# Patient Record
Sex: Female | Born: 1950 | Hispanic: No | Marital: Married | Smoking: Never smoker
Health system: Southern US, Community
[De-identification: ages and names within clinical notes are randomized; demographics above are authoritative.]

## PROBLEM LIST (undated history)

## (undated) DIAGNOSIS — D649 Anemia, unspecified: Secondary | ICD-10-CM

## (undated) DIAGNOSIS — K219 Gastro-esophageal reflux disease without esophagitis: Secondary | ICD-10-CM

## (undated) DIAGNOSIS — D493 Neoplasm of unspecified behavior of breast: Secondary | ICD-10-CM

## (undated) DIAGNOSIS — T7840XA Allergy, unspecified, initial encounter: Secondary | ICD-10-CM

## (undated) DIAGNOSIS — H269 Unspecified cataract: Secondary | ICD-10-CM

## (undated) HISTORY — PX: POLYPECTOMY: SHX149

## (undated) HISTORY — DX: Anemia, unspecified: D64.9

## (undated) HISTORY — PX: COLONOSCOPY: SHX174

## (undated) HISTORY — DX: Neoplasm of unspecified behavior of breast: D49.3

## (undated) HISTORY — DX: Unspecified cataract: H26.9

## (undated) HISTORY — PX: OTHER SURGICAL HISTORY: SHX169

## (undated) HISTORY — PX: HIATAL HERNIA REPAIR: SHX195

## (undated) HISTORY — PX: EYE SURGERY: SHX253

## (undated) HISTORY — DX: Allergy, unspecified, initial encounter: T78.40XA

## (undated) HISTORY — DX: Gastro-esophageal reflux disease without esophagitis: K21.9

---

## 2000-06-12 ENCOUNTER — Other Ambulatory Visit: Admission: RE | Admit: 2000-06-12 | Discharge: 2000-06-12 | Payer: Self-pay | Admitting: Obstetrics and Gynecology

## 2001-12-31 ENCOUNTER — Other Ambulatory Visit: Admission: RE | Admit: 2001-12-31 | Discharge: 2001-12-31 | Payer: Self-pay | Admitting: Obstetrics and Gynecology

## 2003-01-28 ENCOUNTER — Other Ambulatory Visit: Admission: RE | Admit: 2003-01-28 | Discharge: 2003-01-28 | Payer: Self-pay | Admitting: Obstetrics and Gynecology

## 2003-02-23 ENCOUNTER — Ambulatory Visit (HOSPITAL_COMMUNITY): Admission: RE | Admit: 2003-02-23 | Discharge: 2003-02-23 | Payer: Self-pay | Admitting: Obstetrics and Gynecology

## 2003-02-23 ENCOUNTER — Encounter: Payer: Self-pay | Admitting: Obstetrics and Gynecology

## 2005-08-17 ENCOUNTER — Other Ambulatory Visit: Admission: RE | Admit: 2005-08-17 | Discharge: 2005-08-17 | Payer: Self-pay | Admitting: Obstetrics and Gynecology

## 2005-09-15 ENCOUNTER — Encounter: Payer: Self-pay | Admitting: Obstetrics and Gynecology

## 2005-09-15 ENCOUNTER — Ambulatory Visit (HOSPITAL_COMMUNITY): Admission: RE | Admit: 2005-09-15 | Discharge: 2005-09-15 | Payer: Self-pay | Admitting: Obstetrics and Gynecology

## 2005-09-29 ENCOUNTER — Ambulatory Visit: Payer: Self-pay | Admitting: Internal Medicine

## 2005-10-11 ENCOUNTER — Ambulatory Visit: Payer: Self-pay | Admitting: Internal Medicine

## 2006-01-18 ENCOUNTER — Encounter (INDEPENDENT_AMBULATORY_CARE_PROVIDER_SITE_OTHER): Payer: Self-pay | Admitting: *Deleted

## 2006-01-18 ENCOUNTER — Ambulatory Visit (HOSPITAL_COMMUNITY): Admission: RE | Admit: 2006-01-18 | Discharge: 2006-01-19 | Payer: Self-pay | Admitting: General Surgery

## 2006-03-02 ENCOUNTER — Ambulatory Visit: Payer: Self-pay | Admitting: Internal Medicine

## 2006-03-16 ENCOUNTER — Ambulatory Visit: Payer: Self-pay | Admitting: Internal Medicine

## 2006-03-16 ENCOUNTER — Encounter: Payer: Self-pay | Admitting: Internal Medicine

## 2007-09-02 IMAGING — RF DG ESOPHAGUS
7 of 8 series · 19 of 24 positions shown · non-contrast
Comparison: None.

CLINICAL DATA: Dysphagia.
 BARIUM SWALLOW:
TECHNIQUE: The patient was studied both upright and flat.  Included in the exams are 3 per second spot films in the cervical region, AP and lateral, plus ingestion of a 13 mm barium tablet.

[Series 1: run · 6 of 8 slices shown (1 of 7)]
[im 1/8]
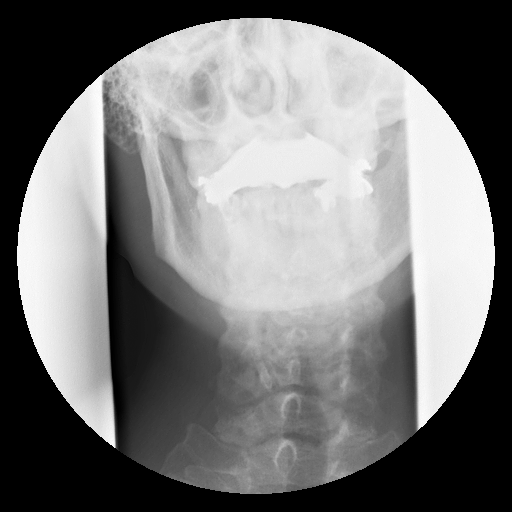
[im 2/8]
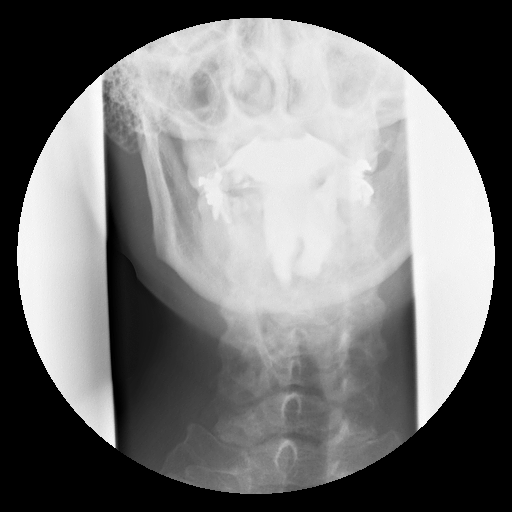
[im 4/8]
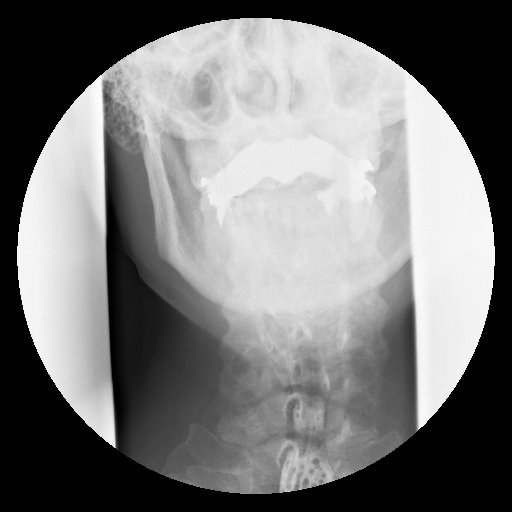
[im 5/8]
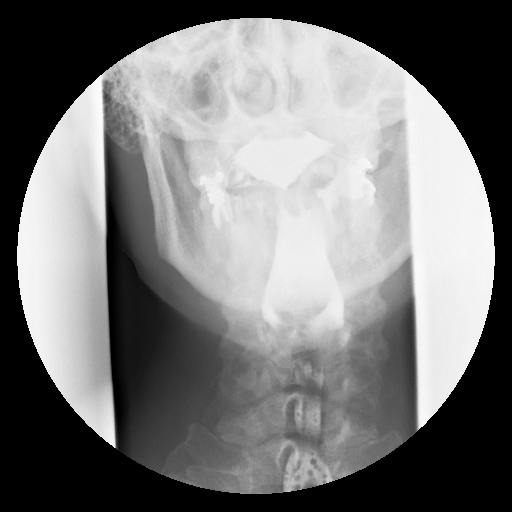
[im 6/8]
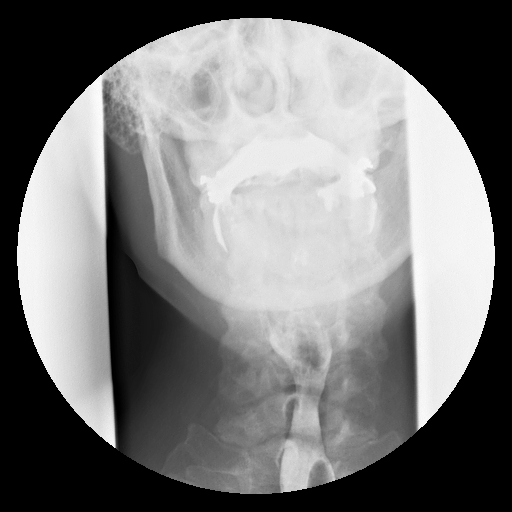
[im 8/8]
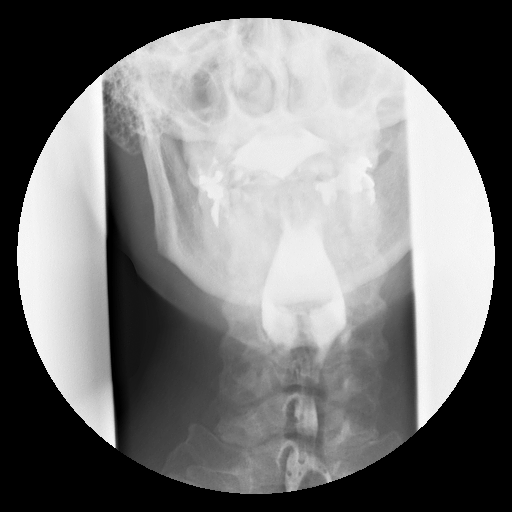

[Series 2: run · 8 of 11 slices shown (2 of 7)]
[im 2/11]
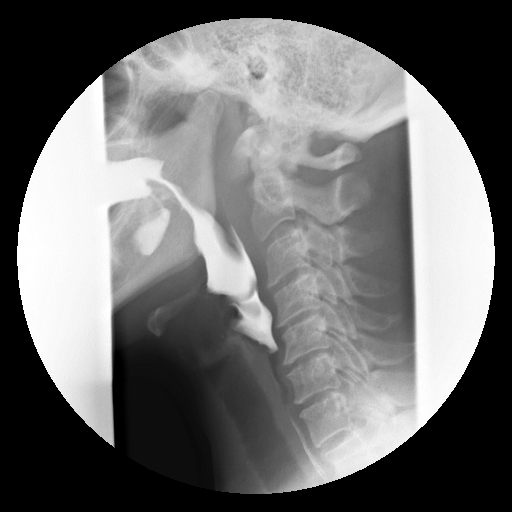
[im 3/11]
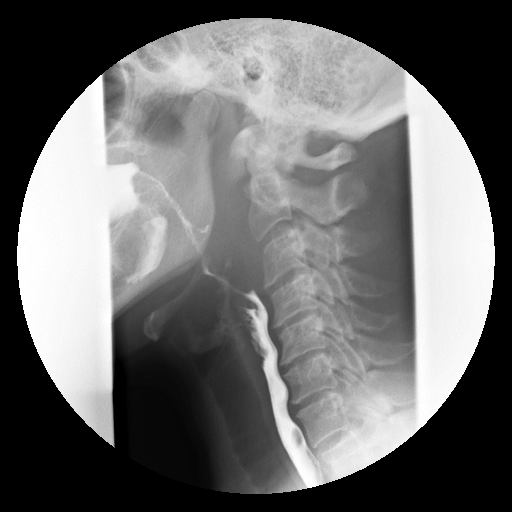
[im 4/11]
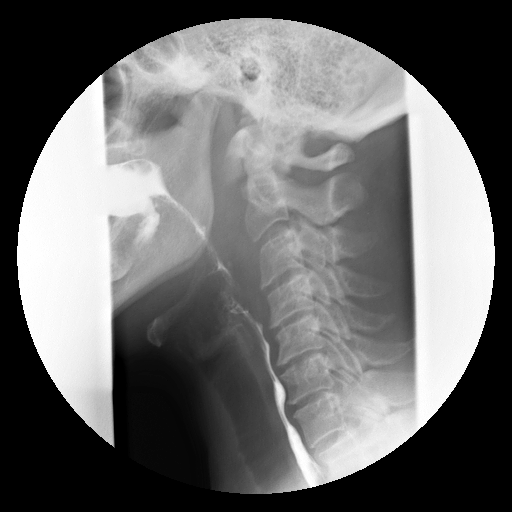
[im 6/11]
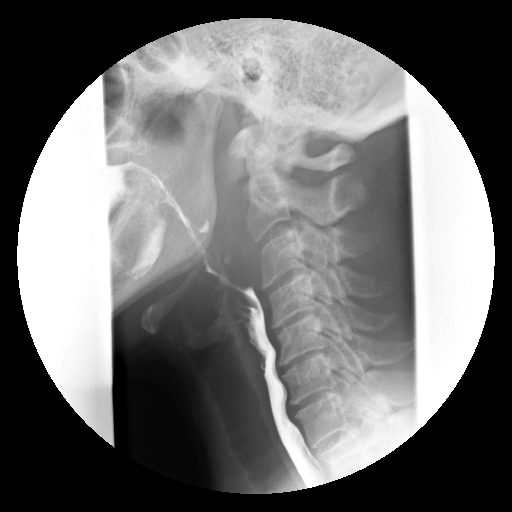
[im 7/11]
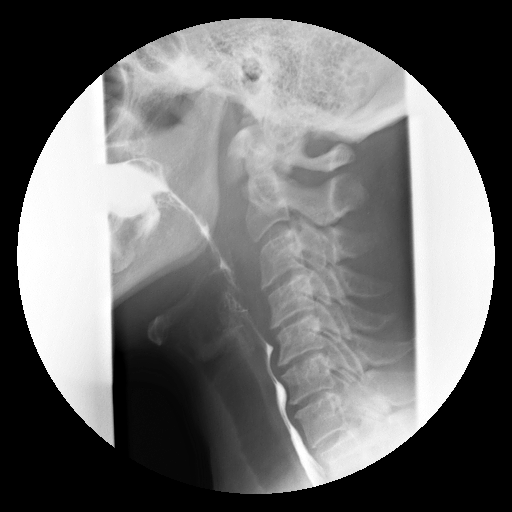
[im 8/11]
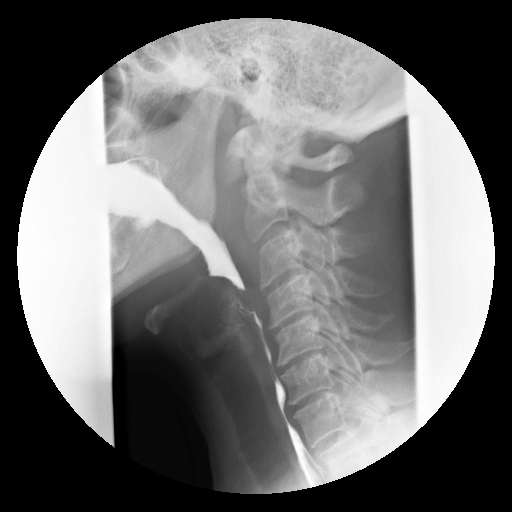
[im 9/11]
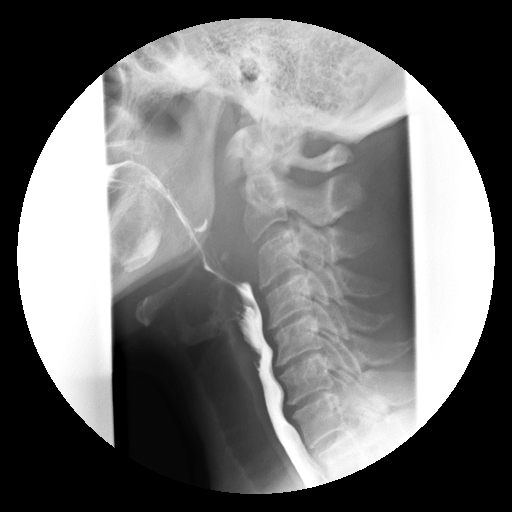
[im 11/11]
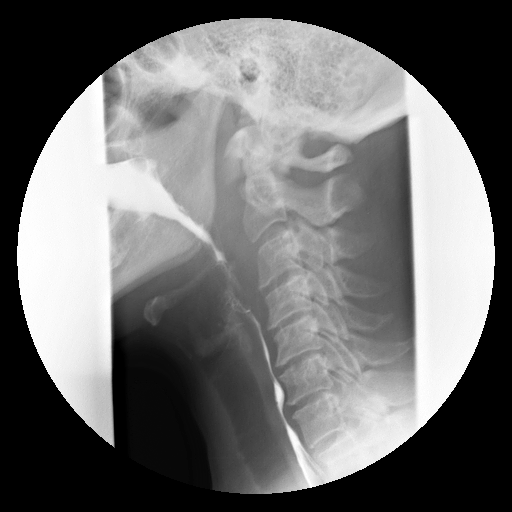

[Series 3: run · 1 of 1 slices shown (3 of 7)]
[im 1/1]
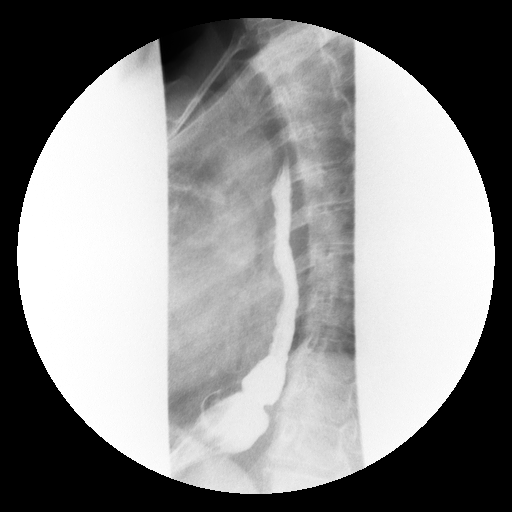

[Series 4: run · 1 of 1 slices shown (4 of 7)]
[im 1/1]
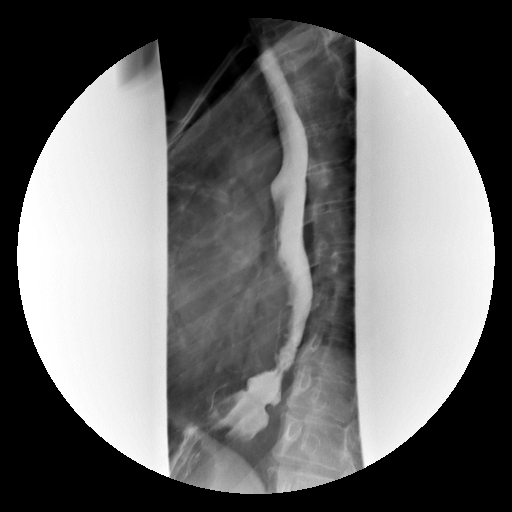

[Series 5: run · 1 of 1 slices shown (5 of 7)]
[im 1/1]
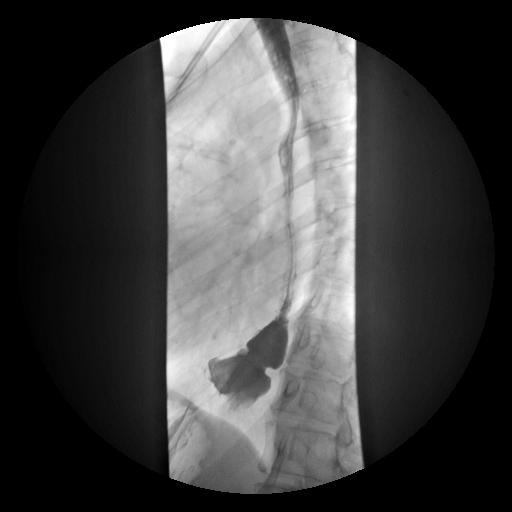

[Series 7: run · 1 of 1 slices shown (6 of 7)]
[im 1/1]
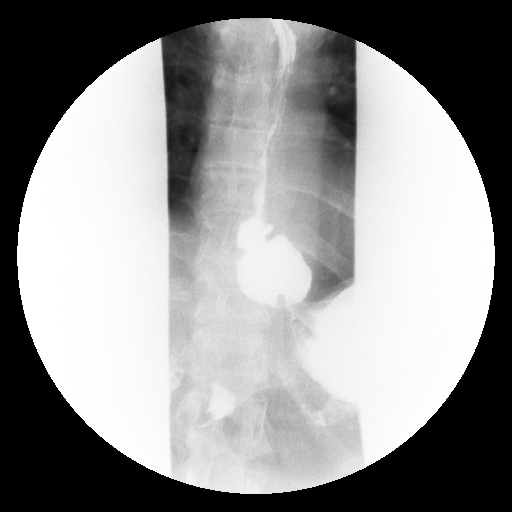

[Series 8: run · 1 of 1 slices shown (7 of 7)]
[im 1/1]
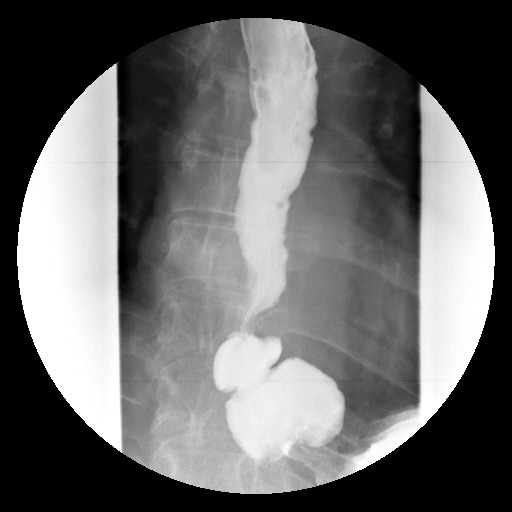

[19 of 24 positions shown; findings below may reference images not displayed]

FINDINGS: Swallowing mechanism normal.  No penetration or aspiration.  There is a moderate-sized hiatal hernia which increases in size with the Valsalva maneuver.  Both the Valsalva and water siphon maneuvers produce a moderate degree of reflux.  There are no definite changes of esophagitis.  No stricture.  The 13 mm barium tablet readily traverses the GE junction.  There is considerable amount of dysmotility with intermittent tertiary contractions.
IMPRESSION: Moderate-sized hiatal hernia with reflux ? no stricture or esophagitis radiographically, but there is moderate dysmotility with spasm.

## 2008-08-19 ENCOUNTER — Emergency Department (HOSPITAL_COMMUNITY): Admission: EM | Admit: 2008-08-19 | Discharge: 2008-08-19 | Payer: Self-pay | Admitting: Emergency Medicine

## 2009-07-23 ENCOUNTER — Emergency Department (HOSPITAL_COMMUNITY): Admission: EM | Admit: 2009-07-23 | Discharge: 2009-07-23 | Payer: Self-pay | Admitting: Family Medicine

## 2010-06-05 ENCOUNTER — Emergency Department (HOSPITAL_COMMUNITY): Admission: EM | Admit: 2010-06-05 | Discharge: 2010-06-05 | Payer: Self-pay | Admitting: Family Medicine

## 2010-08-14 ENCOUNTER — Encounter: Payer: Self-pay | Admitting: Obstetrics and Gynecology

## 2010-12-09 NOTE — Op Note (Signed)
NAMESISSY, GOETZKE             ACCOUNT NO.:  [id number]   MEDICAL RECORD NO.:  [id number]          PATIENT TYPE:  AMB   LOCATION:  DAY                          FACILITY:  [facility]   PHYSICIAN:  Adolph Pollack, M.D.DATE OF BIRTH:  January 04, 1951   DATE OF PROCEDURE:  01/18/2006  DATE OF DISCHARGE:                                 OPERATIVE REPORT   PREOPERATIVE DIAGNOSIS:  Ventral incisional hernias.   POSTOPERATIVE DIAGNOSIS:  Ventral incisional hernias plus endometriosis and  bloody ascites.   PROCEDURE:  Laparoscopic ventral   Dictation ended at this point.      Adolph Pollack, M.D.     Kari Baars  D:  01/18/2006  T:  01/18/2006  Job:  [id number]   cc:   Huel Cote, M.D.  Fax: [phone]

## 2010-12-09 NOTE — Op Note (Signed)
Kaitlin Weeks, Kaitlin Weeks             ACCOUNT NO.:  [id number]   MEDICAL RECORD NO.:  [id number]          PATIENT TYPE:  OIB   LOCATION:  1616                         FACILITY:  [facility]   PHYSICIAN:  Adolph Pollack, M.D.DATE OF BIRTH:  October 21, 1950   DATE OF PROCEDURE:  DATE OF DISCHARGE:  01/19/2006                                 OPERATIVE REPORT   PREOPERATIVE DIAGNOSIS:  Ventral incisional hernias.   POSTOPERATIVE DIAGNOSES:  1.  Ventral incisional hernias.  2.  Endometriosis involving the small bowel and bloody ascites.   PROCEDURES:  1.  Laparoscopic vent and repair of ventral hernias with mesh.  2.  Aspiration of ascitic fluid.   SURGEON:  Adolph Pollack, M.D.   INDICATIONS:  Kaitlin Weeks is a 60 year old female who has had ventral  incisional hernias that has become somewhat more prominent and a little bit  symptomatic.  She has also had dysphagia worked up and it is consistent with  a hiatal hernia and she is improved medically.  She now presents for repair.   TECHNIQUE:  She is seen in the holding area, then brought to the operating  room and placed on the operating table and general anesthetic was  administered.  Foley catheter was placed in the bladder.  The abdominal wall  was sterilely prepped and draped.  A small 5 mm incision was made in the  left upper quadrant.  A 5 mm trocar with OptiVu capability was used and the  5 mm laparoscope introduced.  Under direct vision using the OptiVu trocar,  the peritoneal cavity was entered.  CO2 gas was insufflated creating a  pneumoperitoneum.  The 5 mm laparoscope was then used and there was no  underlying visceral injury noted.  I could see two small ventral incisional  hernias with a little bit of fatty tissue in them.  I then placed a 10 mm  trocar in the left lower quadrant and two 5 mm trocars, one in the right  upper quadrant and one in the right lower quadrant.   Using careful blunt dissection, I reduced some the  fatty tissue/omentum from  the two hernia defects, one in the supraumbilical region and one in the  subumbilical region.  Once this was done, I had adequate exposure of the  periumbilical area.  Of note is  when I first entered the abdominal cavity,  a little bit of bloody type fluid was noted in the right lower quadrant  which I aspirated and sent for cytology.  Also noted, was a small  endometrioma on part of the small bowel as well as an enlarged uterus.  Pictures were taken of this.   Following this, I used spinal needles to mark the edges of the defects and  measured 3-4 cm away from them creating a somewhat circular area.  I then  brought a piece of Parietex mesh with nonadhesive area into the field and  cut it to appropriate fit.  Four anchoring sutures of 0 Novofil were used in  four quadrants.  The mesh was hydrated, rolled up and placed into the  abdominal cavity.  I then placed the rough side facing the abdominal wall  and the  nonadherent barrier facing of the omentum and viscera.  Four stab  incisions were made in four quadrants around the periumbilical area and the  sutures were pulled up across the fascial bridge and tying down anchoring  the mesh initially to the abdominal wall.  I further anchored it to the  abdominal wall with spiral tacks.  This provided for more than adequate  coverage of the defects as well as adequate overlap.   I inspected the area and there was no bleeding noted and no visceral injury.  I subsequently released pneumoperitoneum and removed the trocars.   All skin incisions were then closed with 4-0 Monocryl subcuticular stitches  followed by Steri-Strips and sterile dressings.  She tolerated the procedure  well without any apparent complications and was taken to the recovery room  in satisfactory condition.      Adolph Pollack, M.D.  Electronically Signed     TJR/MEDQ  D:  02/05/2006  T:  02/06/2006  Job:  [id number]   cc:   Huel Cote, M.D.  Fax: [phone]   Wilhemina Bonito. Marina Goodell, M.D. [facility]  [address]  [place]  [place] [address]

## 2011-03-29 ENCOUNTER — Encounter: Payer: Self-pay | Admitting: Internal Medicine

## 2011-04-10 ENCOUNTER — Encounter: Payer: Self-pay | Admitting: Internal Medicine

## 2011-05-16 ENCOUNTER — Other Ambulatory Visit: Payer: Self-pay | Admitting: Internal Medicine

## 2011-05-24 ENCOUNTER — Encounter: Payer: Self-pay | Admitting: Internal Medicine

## 2011-06-30 ENCOUNTER — Ambulatory Visit (AMBULATORY_SURGERY_CENTER): Payer: BC Managed Care – PPO

## 2011-06-30 VITALS — Ht 64.0 in | Wt 161.4 lb

## 2011-06-30 DIAGNOSIS — Z86010 Personal history of colonic polyps: Secondary | ICD-10-CM

## 2011-06-30 DIAGNOSIS — Z1211 Encounter for screening for malignant neoplasm of colon: Secondary | ICD-10-CM

## 2011-06-30 MED ORDER — PEG-KCL-NACL-NASULF-NA ASC-C 100 G PO SOLR: 1.0000 | Freq: Once | ORAL | Status: AC

## 2011-07-03 ENCOUNTER — Encounter: Payer: Self-pay | Admitting: Internal Medicine

## 2011-07-14 ENCOUNTER — Ambulatory Visit (AMBULATORY_SURGERY_CENTER): Payer: BC Managed Care – PPO | Admitting: Internal Medicine

## 2011-07-14 ENCOUNTER — Encounter: Payer: Self-pay | Admitting: Internal Medicine

## 2011-07-14 VITALS — BP 130/79 | HR 72 | Temp 98.1°F | Resp 14 | Ht 64.0 in | Wt 161.0 lb

## 2011-07-14 DIAGNOSIS — D126 Benign neoplasm of colon, unspecified: Secondary | ICD-10-CM

## 2011-07-14 DIAGNOSIS — Z860100 Personal history of colon polyps, unspecified: Secondary | ICD-10-CM

## 2011-07-14 DIAGNOSIS — K5730 Diverticulosis of large intestine without perforation or abscess without bleeding: Secondary | ICD-10-CM

## 2011-07-14 DIAGNOSIS — Z1211 Encounter for screening for malignant neoplasm of colon: Secondary | ICD-10-CM

## 2011-07-14 MED ORDER — SODIUM CHLORIDE 0.9 % IV SOLN: 500.0000 mL | INTRAVENOUS | Status: DC

## 2011-07-14 NOTE — Patient Instructions (Addendum)
Handouts given on polyps, diverticulosis, high fiber diet  Discharge instructions per blue and green sheets  Repeat colonoscopy in 5 years  We will mail you a letter in 1-2 weeks with the pathology results and dr perry's recommendations

## 2011-07-14 NOTE — Progress Notes (Signed)
Patient did not experience any of the following events: a burn prior to discharge; a fall within the facility; wrong site/side/patient/procedure/implant event; or a hospital transfer or hospital admission upon discharge from the facility. (G8907) Patient did not have preoperative order for IV antibiotic SSI prophylaxis. (G8918)  

## 2011-07-14 NOTE — Op Note (Signed)
[facility] [address] [place], [place]  [address]  COLONOSCOPY PROCEDURE REPORT  PATIENT:  Kaitlin Weeks, Kaitlin Weeks  MR#:  [id number] BIRTHDATE:  03-Nov-1950, 60 yrs. old  GENDER:  female ENDOSCOPIST:  Wilhemina Bonito. Eda Keys, MD REF. BY:  Surveillance Program Recall, PROCEDURE DATE:  07/14/2011 PROCEDURE:  Colonoscopy with snare polypectomy x 1 ASA CLASS:  Class II INDICATIONS:  history of pre-cancerous (adenomatous) colon polyps, surveillance and high-risk screening ; index exam 02-2006 w/ TAs MEDICATIONS:   Fentanyl 100 mcg IV, Versed 10 mg IV, These medications were titrated to patient response per physician's verbal order  DESCRIPTION OF PROCEDURE:   After the risks benefits and alternatives of the procedure were thoroughly explained, informed consent was obtained.  Digital rectal exam was performed and revealed no abnormalities.   The LB 180AL [id number] endoscope was introduced through the anus and advanced to the cecum, which was identified by both the appendix and ileocecal valve, without limitations.  The quality of the prep was good, using MoviPrep. The instrument was then slowly withdrawn as the colon was fully examined. <<PROCEDUREIMAGES>>  FINDINGS:  A diminutive polyp was found in the ascending colon and snared without cautery. Retrieval was successful. Severe diverticulosis was found throughout the colon. Otherwise normal colonoscopy without masses, vascular ectasias, or inflammatory changes.   Retroflexed views in the rectum revealed no abnormalities.  The time to cecum =  5:19  minutes. The scope was then withdrawn in 11:07  minutes from the cecum and the procedure completed.  COMPLICATIONS:  None  ENDOSCOPIC IMPRESSION: 1) Diminutive polyp in the ascending colon - removed 2) Severe diverticulosis throughout the colon 3) Otherwise normal colonoscopy  RECOMMENDATIONS: 1) Follow up colonoscopy in 5 years  ______________________________ Wilhemina Bonito. Eda Keys,  MD  CC:  Huel Cote, MD; The Patient  n. eSIGNED:   Wilhemina Bonito. Eda Keys at 07/14/2011 08:50 AM  [place], [phone]

## 2011-07-17 ENCOUNTER — Telehealth: Payer: Self-pay | Admitting: *Deleted

## 2011-07-17 NOTE — Telephone Encounter (Signed)

## 2015-08-23 ENCOUNTER — Encounter: Payer: Self-pay | Admitting: Internal Medicine

## 2016-05-26 ENCOUNTER — Encounter: Payer: Self-pay | Admitting: Internal Medicine

## 2017-01-04 ENCOUNTER — Encounter: Payer: Self-pay | Admitting: Internal Medicine

## 2017-02-27 ENCOUNTER — Ambulatory Visit (AMBULATORY_SURGERY_CENTER): Payer: Self-pay | Admitting: *Deleted

## 2017-02-27 VITALS — Ht 64.0 in | Wt 165.0 lb

## 2017-02-27 DIAGNOSIS — Z86010 Personal history of colonic polyps: Secondary | ICD-10-CM

## 2017-02-27 MED ORDER — NA SULFATE-K SULFATE-MG SULF 17.5-3.13-1.6 GM/177ML PO SOLN: 1.0000 | Freq: Once | ORAL | 0 refills | Status: AC

## 2017-02-27 NOTE — Progress Notes (Signed)
No egg or soy allergy known to patient  No issues with past sedation with any surgeries  or procedures, no intubation problems  No diet pills per patient No home 02 use per patient  No blood thinners per patient  Pt denies issues with constipation  No A fib or A flutter  EMMI video sent to pt's e mail  

## 2017-03-01 ENCOUNTER — Encounter: Payer: Self-pay | Admitting: Internal Medicine

## 2017-03-13 ENCOUNTER — Ambulatory Visit (AMBULATORY_SURGERY_CENTER): Payer: BC Managed Care – PPO | Admitting: Internal Medicine

## 2017-03-13 ENCOUNTER — Encounter: Payer: Self-pay | Admitting: Internal Medicine

## 2017-03-13 VITALS — BP 110/83 | HR 82 | Temp 97.8°F | Resp 16 | Ht 64.0 in | Wt 165.0 lb

## 2017-03-13 DIAGNOSIS — D123 Benign neoplasm of transverse colon: Secondary | ICD-10-CM

## 2017-03-13 DIAGNOSIS — Z86010 Personal history of colonic polyps: Secondary | ICD-10-CM | POA: Diagnosis not present

## 2017-03-13 MED ORDER — SODIUM CHLORIDE 0.9 % IV SOLN: 500.0000 mL | INTRAVENOUS | Status: DC

## 2017-03-13 NOTE — Progress Notes (Signed)
Called to room to assist during endoscopic procedure.  Patient ID and intended procedure confirmed with present staff. Received instructions for my participation in the procedure from the performing physician.  

## 2017-03-13 NOTE — Progress Notes (Signed)
Pt's states no medical or surgical changes since previsit or office visit. 

## 2017-03-13 NOTE — Op Note (Signed)
[place] Patient Name: Kaitlin Weeks Procedure Date: 03/13/2017 2:44 PM MRN: [id number] Endoscopist: Docia Chuck. Henrene Pastor , MD Age: 66 Referring MD:  Date of Birth: April 10, 1951 Gender: Female Account #: [id number] Procedure:                Colonoscopy, with cold snare polypectomy x 1 Indications:              High risk colon cancer surveillance: Personal                            history of multiple (3 or more) adenomas. Previous                            examinations 2007, 2012 Medicines:                Monitored Anesthesia Care Procedure:                Pre-Anesthesia Assessment:                           - Prior to the procedure, a History and Physical                            was performed, and patient medications and                            allergies were reviewed. The patient's tolerance of                            previous anesthesia was also reviewed. The risks                            and benefits of the procedure and the sedation                            options and risks were discussed with the patient.                            All questions were answered, and informed consent                            was obtained. Prior Anticoagulants: The patient has                            taken no previous anticoagulant or antiplatelet                            agents. ASA Grade Assessment: II - A patient with                            mild systemic disease. After reviewing the risks                            and benefits, the patient was deemed in  satisfactory condition to undergo the procedure.                           After obtaining informed consent, the colonoscope                            was passed under direct vision. Throughout the                            procedure, the patient's blood pressure, pulse, and                            oxygen saturations were monitored continuously. The                            Model  CF-[id number]) scope was introduced                            through the anus and advanced to the the cecum,                            identified by appendiceal orifice and ileocecal                            valve. The ileocecal valve, appendiceal orifice,                            and rectum were photographed. The quality of the                            bowel preparation was good. The colonoscopy was                            performed without difficulty. The patient tolerated                            the procedure well. The bowel preparation used was                            SUPREP. Scope In: 2:54:18 PM Scope Out: 3:11:56 PM Scope Withdrawal Time: 0 hours 12 minutes 40 seconds  Total Procedure Duration: 0 hours 17 minutes 38 seconds  Findings:                 A 2 mm polyp was found in the proximal transverse                            colon. The polyp was removed with a cold snare.                            Resection and retrieval were complete.                           Multiple small and large-mouthed diverticula were  found in the left colon and right colon.                           The exam was otherwise without abnormality on                            direct and retroflexion views. Complications:            No immediate complications. Estimated blood loss:                            None. Estimated Blood Loss:     Estimated blood loss: none. Impression:               - One 2 mm polyp in the proximal transverse colon,                            removed with a cold snare. Resected and retrieved.                           - Diverticulosis in the left colon and in the right                            colon.                           - The examination was otherwise normal on direct                            and retroflexion views. Recommendation:           - Repeat colonoscopy in 5 years for surveillance.                           -  Patient has a contact number available for                            emergencies. The signs and symptoms of potential                            delayed complications were discussed with the                            patient. Return to normal activities tomorrow.                            Written discharge instructions were provided to the                            patient.                           - Resume previous diet.                           - Continue present medications.                           -  Await pathology results. Docia Chuck. Henrene Pastor, MD 03/13/2017 3:15:15 PM This report has been signed electronically.

## 2017-03-13 NOTE — Patient Instructions (Signed)
YOU HAD AN ENDOSCOPIC PROCEDURE TODAY AT [facility]:   Refer to the procedure report that was given to you for any specific questions about what was found during the examination.  If the procedure report does not answer your questions, please call your gastroenterologist to clarify.  If you requested that your care partner not be given the details of your procedure findings, then the procedure report has been included in a sealed envelope for you to review at your convenience later.  YOU SHOULD EXPECT: Some feelings of bloating in the abdomen. Passage of more gas than usual.  Walking can help get rid of the air that was put into your GI tract during the procedure and reduce the bloating. If you had a lower endoscopy (such as a colonoscopy or flexible sigmoidoscopy) you may notice spotting of blood in your stool or on the toilet paper. If you underwent a bowel prep for your procedure, you may not have a normal bowel movement for a few days.  Please Note:  You might notice some irritation and congestion in your nose or some drainage.  This is from the oxygen used during your procedure.  There is no need for concern and it should clear up in a day or so.  SYMPTOMS TO REPORT IMMEDIATELY:   Following lower endoscopy (colonoscopy or flexible sigmoidoscopy):  Excessive amounts of blood in the stool  Significant tenderness or worsening of abdominal pains  Swelling of the abdomen that is new, acute  Fever of 100F or higher   For urgent or emergent issues, a gastroenterologist can be reached at any hour by calling [phone].   DIET:  We do recommend a small meal at first, but then you may proceed to your regular diet.  Drink plenty of fluids but you should avoid alcoholic beverages for 24 hours.  ACTIVITY:  You should plan to take it easy for the rest of today and you should NOT DRIVE or use heavy machinery until tomorrow (because of the sedation medicines used during the test).     FOLLOW UP: Our staff will call the number listed on your records the next business day following your procedure to check on you and address any questions or concerns that you may have regarding the information given to you following your procedure. If we do not reach you, we will leave a message.  However, if you are feeling well and you are not experiencing any problems, there is no need to return our call.  We will assume that you have returned to your regular daily activities without incident.  If any biopsies were taken you will be contacted by phone or by letter within the next 1-3 weeks.  Please call us at [phone] if you have not heard about the biopsies in 3 weeks.    SIGNATURES/CONFIDENTIALITY: You and/or your care partner have signed paperwork which will be entered into your electronic medical record.  These signatures attest to the fact that that the information above on your After Visit Summary has been reviewed and is understood.  Full responsibility of the confidentiality of this discharge information lies with you and/or your care-partner.  Polyp information given.  Diverticulosis and high fiber diet given

## 2017-03-13 NOTE — Progress Notes (Signed)
To PACU< VSS. Report to [email or web address] 

## 2017-03-14 ENCOUNTER — Telehealth: Payer: Self-pay

## 2017-03-14 NOTE — Telephone Encounter (Signed)
  Follow up Call-  Call back number 03/13/2017  Post procedure Call Back phone  # [phone]  Permission to leave phone message Yes  Some recent data might be hidden     Patient questions:  Do you have a fever, pain , or abdominal swelling? No. Pain Score  0 *  Have you tolerated food without any problems? Yes.    Have you been able to return to your normal activities? Yes.    Do you have any questions about your discharge instructions: Diet   No. Medications  No. Follow up visit  No.  Do you have questions or concerns about your Care? No.  Actions: * If pain score is 4 or above: No action needed, pain <4.

## 2017-03-19 ENCOUNTER — Encounter: Payer: Self-pay | Admitting: Internal Medicine

## 2017-05-04 ENCOUNTER — Other Ambulatory Visit: Payer: Self-pay | Admitting: Obstetrics and Gynecology

## 2017-05-07 ENCOUNTER — Other Ambulatory Visit: Payer: Self-pay | Admitting: Obstetrics and Gynecology

## 2017-05-07 DIAGNOSIS — E559 Vitamin D deficiency, unspecified: Secondary | ICD-10-CM

## 2018-05-31 ENCOUNTER — Other Ambulatory Visit: Payer: Self-pay | Admitting: Obstetrics and Gynecology

## 2018-05-31 DIAGNOSIS — E2839 Other primary ovarian failure: Secondary | ICD-10-CM

## 2018-08-05 ENCOUNTER — Ambulatory Visit
Admission: RE | Admit: 2018-08-05 | Discharge: 2018-08-05 | Disposition: A | Discharge status: Home / Self Care | Payer: BC Managed Care – PPO | Source: Ambulatory Visit | Attending: Obstetrics and Gynecology | Admitting: Obstetrics and Gynecology

## 2018-08-05 DIAGNOSIS — E2839 Other primary ovarian failure: Secondary | ICD-10-CM

## 2019-01-29 ENCOUNTER — Other Ambulatory Visit: Payer: Self-pay | Admitting: *Deleted

## 2019-01-29 DIAGNOSIS — Z20822 Contact with and (suspected) exposure to covid-19: Secondary | ICD-10-CM

## 2019-02-04 LAB — NOVEL CORONAVIRUS, NAA: SARS-CoV-2, NAA: NOT DETECTED

## 2021-03-18 ENCOUNTER — Telehealth: Payer: Self-pay | Admitting: Internal Medicine

## 2021-03-18 NOTE — Telephone Encounter (Signed)
Pt called looking for appt asap. First avail with APP 04/20/21. Pt wants sooner. She states that she has been having abd pain to the point that she cannot move or breath. She is not sure if it is related to her hernia mesh. Pls call her.

## 2021-03-18 NOTE — Telephone Encounter (Signed)
Pt stating that she is having abdominal pain. Pt states that pain scale is on a 9/10 at times. Pt stated that she feels that its Hernia related. Pt requesting soonest appointment. Pt scheduled for Aug 30 @ 8:40 with Dr. Henrene Pastor. Pt made aware.

## 2021-03-22 ENCOUNTER — Encounter: Payer: Self-pay | Admitting: Internal Medicine

## 2021-03-22 ENCOUNTER — Ambulatory Visit (INDEPENDENT_AMBULATORY_CARE_PROVIDER_SITE_OTHER): Payer: Medicare Other | Admitting: Internal Medicine

## 2021-03-22 ENCOUNTER — Other Ambulatory Visit (INDEPENDENT_AMBULATORY_CARE_PROVIDER_SITE_OTHER): Payer: Medicare Other

## 2021-03-22 VITALS — BP 134/82 | HR 67 | Ht 64.0 in | Wt 162.0 lb

## 2021-03-22 DIAGNOSIS — K625 Hemorrhage of anus and rectum: Secondary | ICD-10-CM

## 2021-03-22 DIAGNOSIS — R109 Unspecified abdominal pain: Secondary | ICD-10-CM

## 2021-03-22 DIAGNOSIS — Z86010 Personal history of colonic polyps: Secondary | ICD-10-CM

## 2021-03-22 DIAGNOSIS — K5900 Constipation, unspecified: Secondary | ICD-10-CM

## 2021-03-22 LAB — BASIC METABOLIC PANEL
BUN: 15 mg/dL (ref 6–23)
CO2: 28 mEq/L (ref 19–32)
Calcium: 9.9 mg/dL (ref 8.4–10.5)
Chloride: 104 mEq/L (ref 96–112)
Creatinine, Ser: 0.86 mg/dL (ref 0.40–1.20)
GFR: 68.87 mL/min (ref >60.00)
Glucose, Bld: 87 mg/dL (ref 70–99)
Potassium: 4.8 mEq/L (ref 3.5–5.1)
Sodium: 139 mEq/L (ref 135–145)

## 2021-03-22 MED ORDER — SUTAB 1479-225-188 MG PO TABS: 1.0000 | ORAL_TABLET | Freq: Once | ORAL | 0 refills | Status: AC

## 2021-03-22 NOTE — Patient Instructions (Addendum)
If you are age 70 or older, your body mass index should be between 23-30. Your Body mass index is 27.81 kg/m. If this is out of the aforementioned range listed, please consider follow up with your Primary Care Provider.  If you are age 21 or younger, your body mass index should be between 19-25. Your Body mass index is 27.81 kg/m. If this is out of the aformentioned range listed, please consider follow up with your Primary Care Provider.   __________________________________________________________  The [facility] providers would like to encourage you to use [facility] to communicate with providers for non-urgent requests or questions.  Due to long hold times on the telephone, sending your provider a message by [facility] may be a faster and more efficient way to get a response.  Please allow 48 business hours for a response.  Please remember that this is for non-urgent requests.   You have been scheduled for a CT scan of the abdomen and pelvis at [facility], 1st floor Radiology. You are scheduled on 03/25/2021  at 3:30pm. You should arrive 15 minutes prior to your appointment time for registration.  Please pick up 2 bottles of contrast from [place] at least 3 days prior to your scan. The solution may taste better if refrigerated, but do NOT add ice or any other liquid to this solution. Shake well before drinking.   Please follow the written instructions below on the day of your exam:   1) Do not eat anything after 11:30am (4 hours prior to your test)   2) Drink 1 bottle of contrast @ 1:30pm (2 hours prior to your exam)  Remember to shake well before drinking and do NOT pour over ice.     Drink 1 bottle of contrast @ 2:30pm (1 hour prior to your exam)   You may take any medications as prescribed with a small amount of water, if necessary. If you take any of the following medications: METFORMIN, GLUCOPHAGE, GLUCOVANCE, AVANDAMET, RIOMET, FORTAMET, [place] MET, JANUMET, GLUMETZA or METAGLIP,  you MAY be asked to HOLD this medication 48 hours AFTER the exam.   The purpose of you drinking the oral contrast is to aid in the visualization of your intestinal tract. The contrast solution may cause some diarrhea. Depending on your individual set of symptoms, you may also receive an intravenous injection of x-ray contrast/dye. Plan on being at [facility] for 45 minutes or longer, depending on the type of exam you are having performed.   If you have any questions regarding your exam or if you need to reschedule, you may call [facility] at [phone] between the hours of 8:00 am and 5:00 pm, Monday-Friday.   Take 1-2 tablespoons of [place] daily  Your provider has requested that you go to the basement level for lab work before leaving today. Press "B" on the elevator. The lab is located at the first door on the left as you exit the elevator.

## 2021-03-22 NOTE — Progress Notes (Signed)
HISTORY OF PRESENT ILLNESS:  Kaitlin Weeks is a 70 y.o. female with a history of GERD complicated by peptic stricture (on no medical therapy), and multiple adenomatous colon polyps with prior colonoscopy 2007, 2012, 2018 who presents today for evaluation of severe right-sided abdominal pain.  Patient tells me that approximately 6 days ago she developed severe and persistent right-sided abdominal pain.  Around that time she was somewhat constipated (which is unusual for her).  Several days thereafter she did not have a bowel movement until taking a laxative.  Thereafter she did have relief of her discomfort.  Now feeling better.  She has had prior umbilical hernia repair.  No weight loss.  She does report 4-year history of fecal smearing on occasion.  Also has had some intermittent rectal bleeding over the past year.  She feels possibly due to hemorrhoid.  She is concerned.  Last upper endoscopy 2007.  REVIEW OF SYSTEMS:  All non-GI ROS negative unless otherwise stated in the HPI except for headaches, sore throat, back pain  Past Medical History:  Diagnosis Date   Allergy    Anemia    past hx- hgb 11   Cataract    not mature   GERD (gastroesophageal reflux disease)    Glaucoma    Tumor of breast    left    Past Surgical History:  Procedure Laterality Date   breast tumor removed     left   COLONOSCOPY     EYE SURGERY     HIATAL HERNIA REPAIR     POLYPECTOMY      Social History MAZZIE FARELL  reports that she has never smoked. She has never used smokeless tobacco. She reports that she does not drink alcohol and does not use drugs.  family history includes Colon polyps in her brother.  Allergies  Allergen Reactions   Penicillins Other (See Comments)    Causes yeast infections        PHYSICAL EXAMINATION: Vital signs: BP 134/82   Pulse 67   Ht '5\' 4"'$  (1.626 m)   Wt 162 lb (73.5 kg)   SpO2 96%   BMI 27.81 kg/m   Constitutional: generally well-appearing, no acute  distress Psychiatric: alert and oriented x3, cooperative Eyes: extraocular movements intact, anicteric, conjunctiva pink Mouth: oral pharynx moist, no lesions Neck: supple no lymphadenopathy Cardiovascular: heart regular rate and rhythm, no murmur Lungs: clear to auscultation bilaterally Abdomen: soft, nontender, nondistended, no obvious ascites, no peritoneal signs, normal bowel sounds, no organomegaly Rectal: Deferred until colonoscopy Extremities: no clubbing, cyanosis, or lower extremity edema bilaterally Skin: no lesions on visible extremities Neuro: No focal deficits.  Cranial nerves intact  ASSESSMENT:  1.  Severe right-sided abdominal pain as described.  Now resolved.  I suspect this was secondary to constipation.  She is concerned about more ominous diagnoses. 2.  Change in bowel habits.  Transient constipation as described. 3.  History of fecal smearing or incontinence 4.  Rectal bleeding, minor but recurrent. 5.  History of multiple adenomatous colon polyps.  Last colonoscopy 2018 6.  GERD.  On no medical therapy.  Peptic stricture on remote EGD 7.  General medical problems.  Stable  PLAN:  1.  Recommend Citrucel 2 tablespoons daily for change in bowel habits and fecal smearing 2.  Colonoscopy to evaluate change in bowel habits and rectal bleeding in the patient with multiple adenomatous colon polyps who is almost due for her routine surveillance examination.The nature of the procedure, as well as  the risks, benefits, and alternatives were carefully and thoroughly reviewed with the patient. Ample time for discussion and questions allowed. The patient understood, was satisfied, and agreed to proceed.  3.  Schedule CT scan of the abdomen pelvis to evaluate several day history of severe right-sided pain of uncertain etiology. 4.  Refractory reflux symptoms consider PPI

## 2021-03-25 ENCOUNTER — Other Ambulatory Visit: Payer: Self-pay

## 2021-03-25 ENCOUNTER — Ambulatory Visit (HOSPITAL_COMMUNITY)
Admission: RE | Admit: 2021-03-25 | Discharge: 2021-03-25 | Disposition: A | Discharge status: Home / Self Care | Payer: Medicare Other | Source: Ambulatory Visit | Attending: Internal Medicine | Admitting: Internal Medicine

## 2021-03-25 DIAGNOSIS — K625 Hemorrhage of anus and rectum: Secondary | ICD-10-CM | POA: Diagnosis present

## 2021-03-25 DIAGNOSIS — K5900 Constipation, unspecified: Secondary | ICD-10-CM | POA: Insufficient documentation

## 2021-03-25 DIAGNOSIS — R109 Unspecified abdominal pain: Secondary | ICD-10-CM | POA: Insufficient documentation

## 2021-03-25 DIAGNOSIS — Z86010 Personal history of colonic polyps: Secondary | ICD-10-CM | POA: Diagnosis present

## 2021-03-25 MED ORDER — IOHEXOL 350 MG/ML SOLN
80.0000 mL | Freq: Once | INTRAVENOUS | Status: AC | PRN
  Administered 2021-03-25: 80 mL via INTRAVENOUS

## 2021-03-30 ENCOUNTER — Telehealth: Payer: Self-pay | Admitting: Internal Medicine

## 2021-03-30 NOTE — Telephone Encounter (Signed)
Pt called stating that her pharmacy did not receive prescription for Sutab. Pls send it to [facility] on [address]. in [place].

## 2021-03-31 MED ORDER — SUTAB 1479-225-188 MG PO TABS: 1.0000 | ORAL_TABLET | Freq: Once | ORAL | 0 refills | Status: AC

## 2021-03-31 NOTE — Telephone Encounter (Signed)
Sutab sent to [facility]

## 2021-04-04 ENCOUNTER — Ambulatory Visit (AMBULATORY_SURGERY_CENTER): Payer: Medicare Other | Admitting: Internal Medicine

## 2021-04-04 ENCOUNTER — Encounter: Payer: Self-pay | Admitting: Internal Medicine

## 2021-04-04 ENCOUNTER — Other Ambulatory Visit: Payer: Self-pay

## 2021-04-04 VITALS — BP 124/76 | HR 69 | Temp 96.4°F | Resp 15 | Ht 64.0 in | Wt 162.0 lb

## 2021-04-04 DIAGNOSIS — K5900 Constipation, unspecified: Secondary | ICD-10-CM

## 2021-04-04 DIAGNOSIS — R1031 Right lower quadrant pain: Secondary | ICD-10-CM

## 2021-04-04 DIAGNOSIS — K648 Other hemorrhoids: Secondary | ICD-10-CM

## 2021-04-04 DIAGNOSIS — D123 Benign neoplasm of transverse colon: Secondary | ICD-10-CM | POA: Diagnosis not present

## 2021-04-04 DIAGNOSIS — Z86010 Personal history of colonic polyps: Secondary | ICD-10-CM

## 2021-04-04 DIAGNOSIS — R194 Change in bowel habit: Secondary | ICD-10-CM

## 2021-04-04 DIAGNOSIS — D122 Benign neoplasm of ascending colon: Secondary | ICD-10-CM

## 2021-04-04 DIAGNOSIS — K625 Hemorrhage of anus and rectum: Secondary | ICD-10-CM

## 2021-04-04 DIAGNOSIS — R109 Unspecified abdominal pain: Secondary | ICD-10-CM

## 2021-04-04 DIAGNOSIS — K5730 Diverticulosis of large intestine without perforation or abscess without bleeding: Secondary | ICD-10-CM | POA: Diagnosis not present

## 2021-04-04 MED ORDER — SODIUM CHLORIDE 0.9 % IV SOLN: 500.0000 mL | Freq: Once | INTRAVENOUS | Status: DC

## 2021-04-04 NOTE — Progress Notes (Signed)
GI PREPROCEDURE H&P  The patient was fully evaluated in the office on March 22, 2021 regarding abdominal pain, change in bowel habits, and minor rectal bleeding.  She is now for colonoscopy.  Please see that full H&P/evaluation.  There have been no interval clinical changes.

## 2021-04-04 NOTE — Progress Notes (Signed)
Called to room to assist during endoscopic procedure.  Patient ID and intended procedure confirmed with present staff. Received instructions for my participation in the procedure from the performing physician.  

## 2021-04-04 NOTE — Progress Notes (Signed)
VS taken MO

## 2021-04-04 NOTE — Op Note (Signed)
[facility] [facility] Patient Name: Kaitlin Weeks Procedure Date: 04/04/2021 8:35 AM MRN: [id number] Endoscopist: Docia Chuck. Henrene Pastor , MD Age: 70 Referring MD:  Date of Birth: [birth year] Gender: Female Account #: [id number] Procedure:                Colonoscopy with cold snare polypectomy x 3 Indications:              Abdominal pain in the right lower quadrant, Rectal                            bleeding, Change in bowel habits. History of                            multiple adenomatous colon polyps. Previous                            examinations 2007, 2012, 2018 Medicines:                Monitored Anesthesia Care Procedure:                Pre-Anesthesia Assessment:                           - Prior to the procedure, a History and Physical                            was performed, and patient medications and                            allergies were reviewed. The patient's tolerance of                            previous anesthesia was also reviewed. The risks                            and benefits of the procedure and the sedation                            options and risks were discussed with the patient.                            All questions were answered, and informed consent                            was obtained. Prior Anticoagulants: The patient has                            taken no previous anticoagulant or antiplatelet                            agents. ASA Grade Assessment: II - A patient with                            mild systemic disease. After reviewing the risks  and benefits, the patient was deemed in                            satisfactory condition to undergo the procedure.                           After obtaining informed consent, the colonoscope                            was passed under direct vision. Throughout the                            procedure, the patient's blood pressure, pulse, and                            oxygen  saturations were monitored continuously. The                            Olympus CF-HQ190L [id number]) Colonoscope was                            introduced through the anus and advanced to the the                            cecum, identified by appendiceal orifice and                            ileocecal valve. The ileocecal valve, appendiceal                            orifice, and rectum were photographed. The quality                            of the bowel preparation was excellent. The                            colonoscopy was performed without difficulty. The                            patient tolerated the procedure well. The bowel                            preparation used was SUPREP via split dose                            instruction. Scope In: 8:50:09 AM Scope Out: 9:07:21 AM Scope Withdrawal Time: 0 hours 13 minutes 12 seconds  Total Procedure Duration: 0 hours 17 minutes 12 seconds  Findings:                 Three polyps were found in the descending colon and                            ascending colon. The polyps were 2 to 3 mm in size.  These polyps were removed with a cold snare.                            Resection and retrieval were complete.                           Multiple diverticula were found in the entire colon.                           Internal hemorrhoids were found during retroflexion.                           The exam was otherwise without abnormality on                            direct and retroflexion views. Complications:            No immediate complications. Estimated blood loss:                            None. Estimated Blood Loss:     Estimated blood loss: none. Impression:               - Three 2 to 3 mm polyps in the descending colon                            and in the ascending colon, removed with a cold                            snare. Resected and retrieved.                           - Diverticulosis in the entire  examined colon.                           - Internal hemorrhoids.                           - The examination was otherwise normal on direct                            and retroflexion views. Recommendation:           - Repeat colonoscopy in 5 years for surveillance.                           - Patient has a contact number available for                            emergencies. The signs and symptoms of potential                            delayed complications were discussed with the                            patient. Return to normal activities tomorrow.  Written discharge instructions were provided to the                            patient.                           - Resume previous diet.                           - Continue present medications.                           - Await pathology results. Docia Chuck. Henrene Pastor, MD 04/04/2021 9:14:37 AM This report has been signed electronically.

## 2021-04-04 NOTE — Progress Notes (Signed)
Sedate, gd SR, tolerated procedure well, VSS, report to RN 

## 2021-04-04 NOTE — Patient Instructions (Signed)
Handout given:  polyps, diverticulosis Resume previous diet Continue current medications Await pathology results  YOU HAD AN ENDOSCOPIC PROCEDURE TODAY AT [facility]:   Refer to the procedure report that was given to you for any specific questions about what was found during the examination.  If the procedure report does not answer your questions, please call your gastroenterologist to clarify.  If you requested that your care partner not be given the details of your procedure findings, then the procedure report has been included in a sealed envelope for you to review at your convenience later.  YOU SHOULD EXPECT: Some feelings of bloating in the abdomen. Passage of more gas than usual.  Walking can help get rid of the air that was put into your GI tract during the procedure and reduce the bloating. If you had a lower endoscopy (such as a colonoscopy or flexible sigmoidoscopy) you may notice spotting of blood in your stool or on the toilet paper. If you underwent a bowel prep for your procedure, you may not have a normal bowel movement for a few days.  Please Note:  You might notice some irritation and congestion in your nose or some drainage.  This is from the oxygen used during your procedure.  There is no need for concern and it should clear up in a day or so.  SYMPTOMS TO REPORT IMMEDIATELY:  Following lower endoscopy (colonoscopy or flexible sigmoidoscopy):  Excessive amounts of blood in the stool  Significant tenderness or worsening of abdominal pains  Swelling of the abdomen that is new, acute  Fever of 100F or higher  For urgent or emergent issues, a gastroenterologist can be reached at any hour by calling [phone]. Do not use MyChart messaging for urgent concerns.   DIET:  We do recommend a small meal at first, but then you may proceed to your regular diet.  Drink plenty of fluids but you should avoid alcoholic beverages for 24 hours.  ACTIVITY:  You should  plan to take it easy for the rest of today and you should NOT DRIVE or use heavy machinery until tomorrow (because of the sedation medicines used during the test).    FOLLOW UP: Our staff will call the number listed on your records 48-72 hours following your procedure to check on you and address any questions or concerns that you may have regarding the information given to you following your procedure. If we do not reach you, we will leave a message.  We will attempt to reach you two times.  During this call, we will ask if you have developed any symptoms of COVID 19. If you develop any symptoms (ie: fever, flu-like symptoms, shortness of breath, cough etc.) before then, please call [phone].  If you test positive for Covid 19 in the 2 weeks post procedure, please call and report this information to [place].    If any biopsies were taken you will be contacted by phone or by letter within the next 1-3 weeks.  Please call us at [phone] if you have not heard about the biopsies in 3 weeks.   SIGNATURES/CONFIDENTIALITY: You and/or your care partner have signed paperwork which will be entered into your electronic medical record.  These signatures attest to the fact that that the information above on your After Visit Summary has been reviewed and is understood.  Full responsibility of the confidentiality of this discharge information lies with you and/or your care-partner.

## 2021-04-06 ENCOUNTER — Encounter: Payer: Self-pay | Admitting: Internal Medicine

## 2021-04-06 ENCOUNTER — Telehealth: Payer: Self-pay | Admitting: *Deleted

## 2021-04-06 ENCOUNTER — Telehealth: Payer: Self-pay

## 2021-04-06 NOTE — Telephone Encounter (Signed)
Attempted to reach patient for post-procedure f/u call. No answer. Left message for her to please not hesitate to call us if she has any questions/concerns regarding her care. 

## 2021-04-06 NOTE — Telephone Encounter (Signed)
First follow up call attempt.  LVM.
